# Patient Record
Sex: Female | Born: 2000 | Race: White | Hispanic: No | Marital: Single | Smoking: Former smoker
Health system: Southern US, Community
[De-identification: ages and names within clinical notes are randomized; demographics above are authoritative.]

## PROBLEM LIST (undated history)

## (undated) DIAGNOSIS — F988 Other specified behavioral and emotional disorders with onset usually occurring in childhood and adolescence: Secondary | ICD-10-CM

## (undated) DIAGNOSIS — J302 Other seasonal allergic rhinitis: Secondary | ICD-10-CM

## (undated) DIAGNOSIS — F4310 Post-traumatic stress disorder, unspecified: Secondary | ICD-10-CM

---
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
## 2020-05-24 ENCOUNTER — Emergency Department (HOSPITAL_COMMUNITY): Payer: Medicaid Other

## 2020-05-24 ENCOUNTER — Encounter (HOSPITAL_COMMUNITY): Payer: Self-pay | Admitting: Emergency Medicine

## 2020-05-24 ENCOUNTER — Emergency Department (HOSPITAL_COMMUNITY)
Admission: EM | Admit: 2020-05-24 | Discharge: 2020-05-24 | Disposition: A | Discharge status: Home / Self Care | Payer: Medicaid Other | Attending: Emergency Medicine | Admitting: Emergency Medicine

## 2020-05-24 DIAGNOSIS — Z87891 Personal history of nicotine dependence: Secondary | ICD-10-CM | POA: Diagnosis not present

## 2020-05-24 DIAGNOSIS — R0782 Intercostal pain: Secondary | ICD-10-CM | POA: Insufficient documentation

## 2020-05-24 DIAGNOSIS — T148XXA Other injury of unspecified body region, initial encounter: Secondary | ICD-10-CM

## 2020-05-24 DIAGNOSIS — X501XXA Overexertion from prolonged static or awkward postures, initial encounter: Secondary | ICD-10-CM | POA: Diagnosis not present

## 2020-05-24 LAB — POC URINE PREG, ED: Preg Test, Ur: NEGATIVE

## 2020-05-24 MED ORDER — IBUPROFEN 800 MG PO TABS
800.0000 mg | ORAL_TABLET | Freq: Once | ORAL | Status: AC
  Administered 2020-05-24: 800 mg via ORAL
  Filled 2020-05-24: qty 1
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
## 2020-05-24 NOTE — ED Triage Notes (Signed)
Per patient, states she is having right flank/rib pain-some discomfort with inhalation-states no trauma or injury-states she might be pregnant-LMP was 2 months ago-having unprotected sex

## 2020-05-24 NOTE — Discharge Instructions (Addendum)
Use Tylenol and/or Motrin as directed for pain. °

## 2021-12-05 IMAGING — CR DG RIBS W/ CHEST 3+V*R*
5 series · 5 of 5 positions shown · non-contrast
Comparison: 02/26/2007

CLINICAL DATA: Lower posterior right rib pain

EXAM:
RIGHT RIBS AND CHEST - 3+ VIEW

[w chest pa]
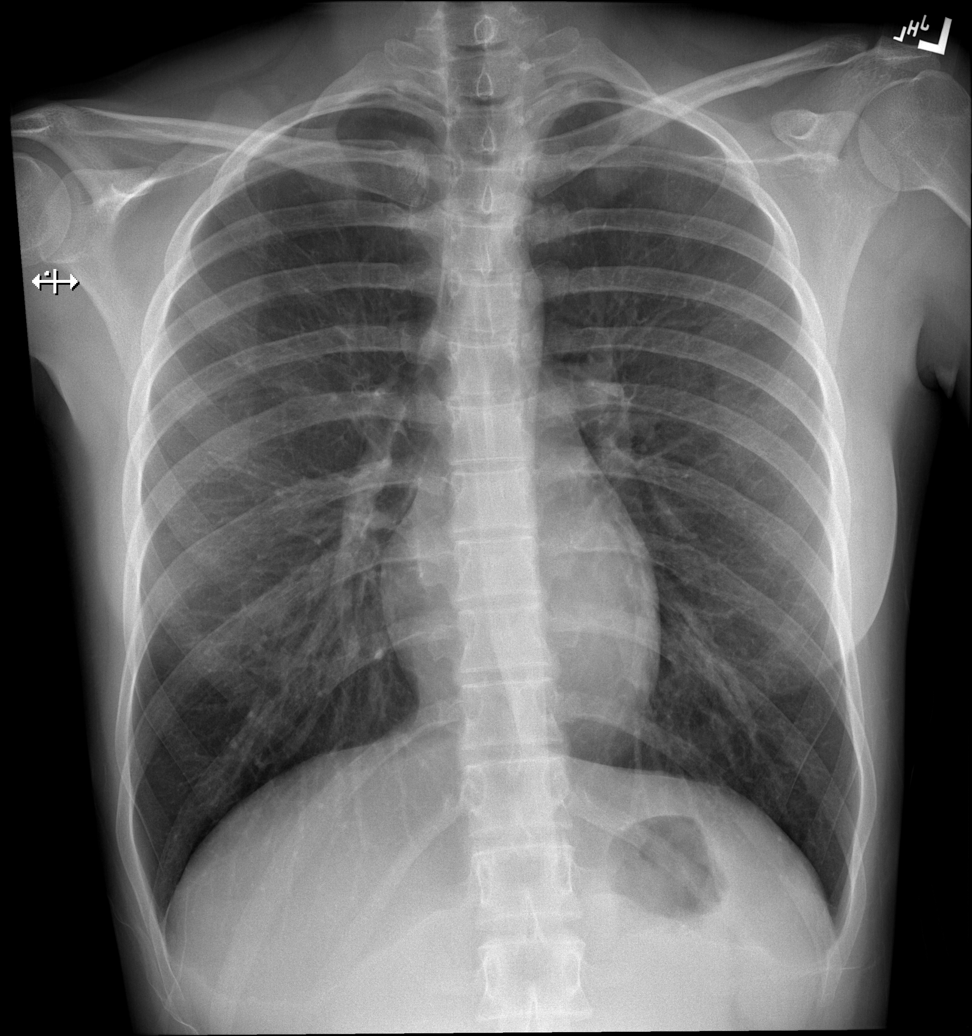

[w ribs ap upper right]
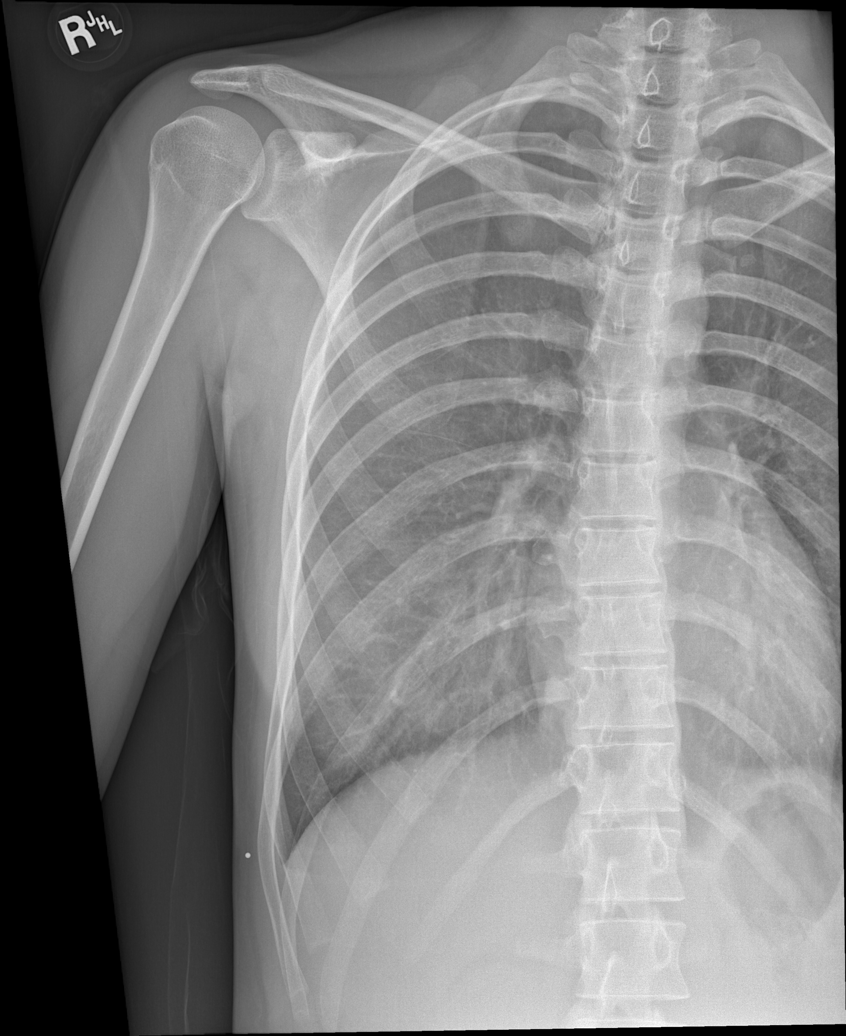

[w ribs ap lower right]
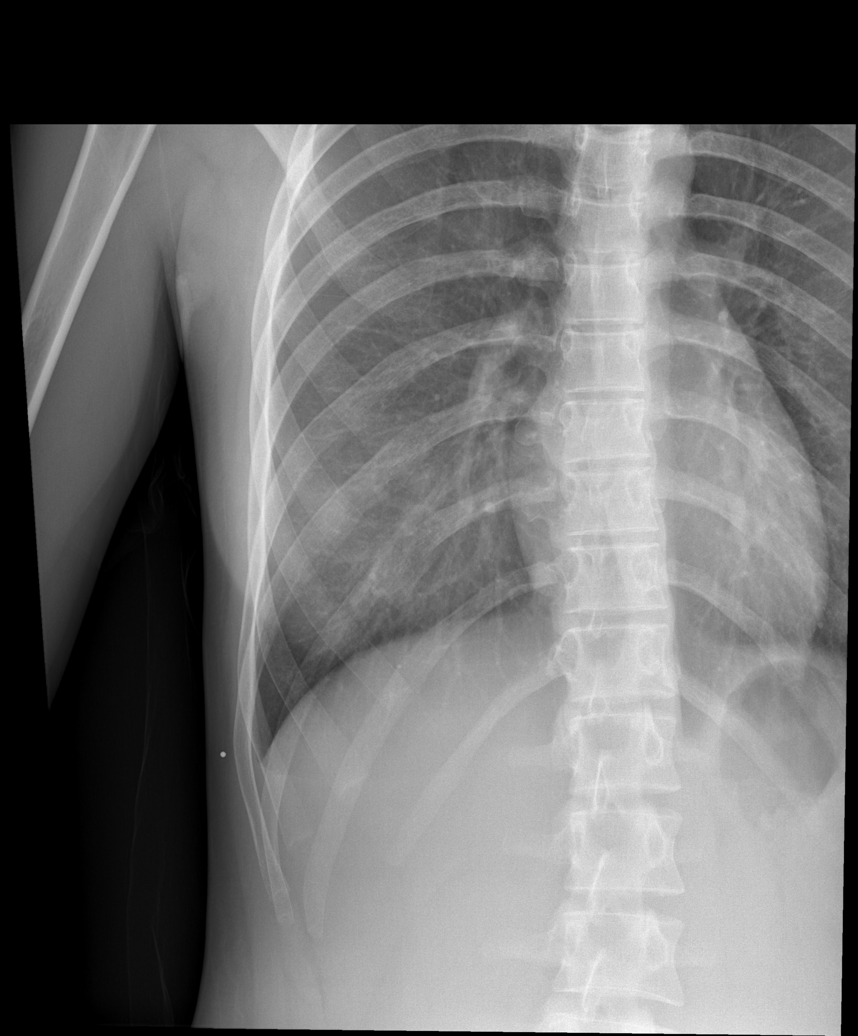

[w ribs obl right (1 of 2)]
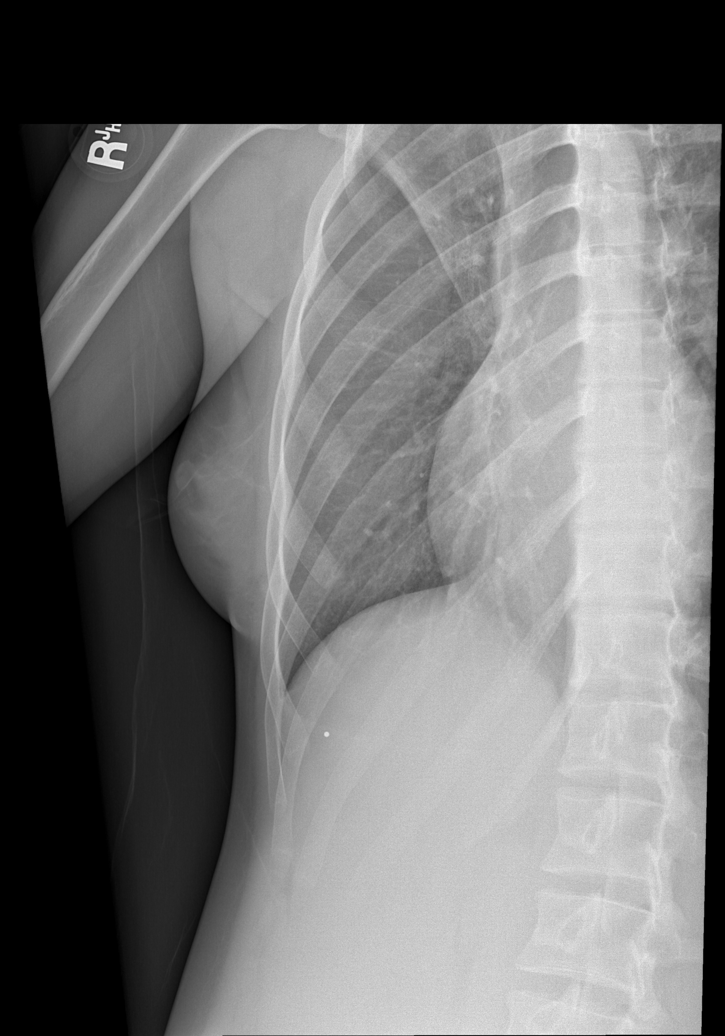

[w ribs obl right (2 of 2)]
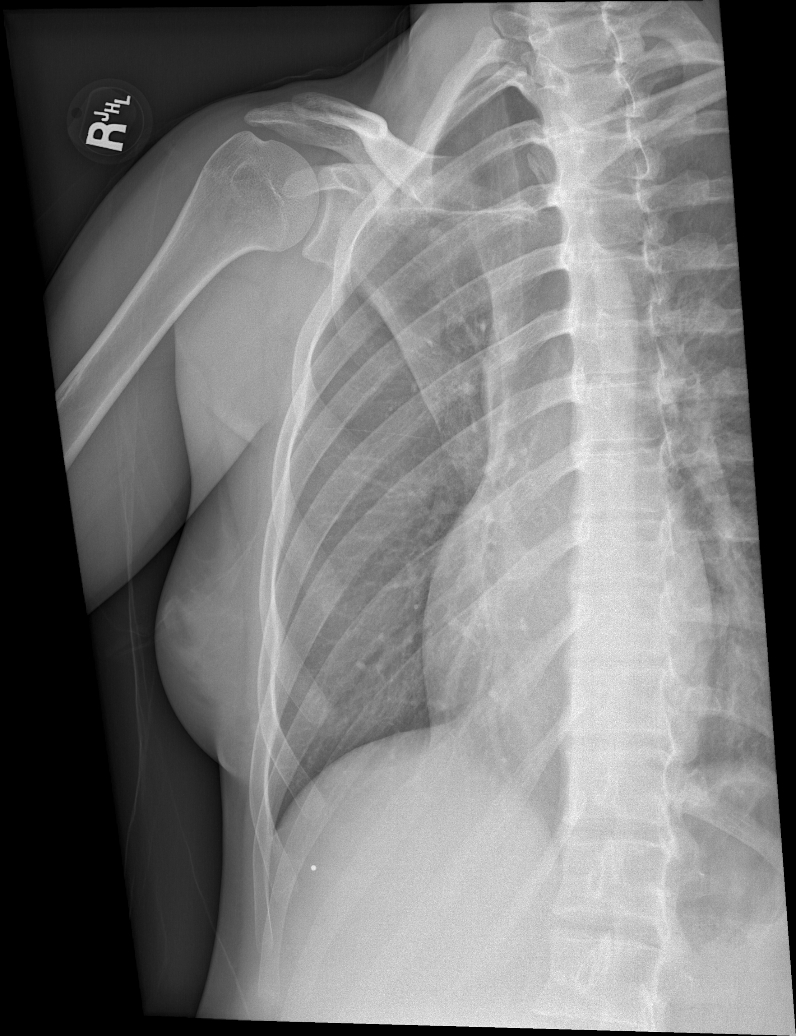

[5 of 5 positions shown; findings below may reference images not displayed]

FINDINGS: No fracture or other bone lesions are seen involving the ribs. There
is no evidence of pneumothorax or pleural effusion. Both lungs are
clear. Heart size and mediastinal contours are within normal limits.
IMPRESSION: Negative.
